# Patient Record
Sex: Male | Born: 2000 | Race: White | Hispanic: No | Marital: Single | Smoking: Never smoker
Health system: Southern US, Community
[De-identification: ages and names within clinical notes are randomized; demographics above are authoritative.]

---
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
## 2020-08-30 ENCOUNTER — Ambulatory Visit: Payer: Self-pay | Admitting: Family Medicine

## 2020-09-05 ENCOUNTER — Encounter: Payer: Self-pay | Admitting: Family Medicine

## 2020-09-05 ENCOUNTER — Ambulatory Visit (INDEPENDENT_AMBULATORY_CARE_PROVIDER_SITE_OTHER): Payer: Commercial Managed Care - PPO | Admitting: Family Medicine

## 2020-09-05 ENCOUNTER — Other Ambulatory Visit: Payer: Self-pay

## 2020-09-05 VITALS — BP 130/82 | HR 99 | Temp 98.8°F | Ht 73.0 in | Wt 193.0 lb

## 2020-09-05 DIAGNOSIS — Z6825 Body mass index (BMI) 25.0-25.9, adult: Secondary | ICD-10-CM | POA: Diagnosis not present

## 2020-09-05 DIAGNOSIS — Z7689 Persons encountering health services in other specified circumstances: Secondary | ICD-10-CM

## 2020-09-05 NOTE — Progress Notes (Signed)
New Patient Office Visit  Subjective:  Patient ID: Javier Hess, male    DOB: 12-Dec-2000  Age: 20 y.o. MRN: [id number]  CC:  Chief Complaint  Patient presents with  . New Patient (Initial Visit)    HPI Javier Hess presents for to establish care.  Javier Hess has been followed by a pediatrician but has now aged out. Javier Hess reports doing well and denies any concerns today. 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 Javier Hess denies family history of diabetes or heart disease. Javier Hess is active and reports a generally healthy diet. Javier Hess has seen an eye doctor in the past, but not recently. Javier Hess sees the dentist regularly.     History reviewed. No pertinent past medical history.  History reviewed. No pertinent surgical history.  Family History  Problem Relation Age of Onset  . Cancer Maternal Grandmother        Breast Cancer  . Stroke Paternal Grandfather     Social History   Socioeconomic History  . Marital status: Single    Spouse name: Not on file  . Number of children: Not on file  . Years of education: Not on file  . Highest education level: Not on file  Occupational History  . Occupation: Consulting civil engineer  Tobacco Use  . Smoking status: Never Smoker  . Smokeless tobacco: Never Used  Vaping Use  . Vaping Use: Never used  Substance and Sexual Activity  . Alcohol use: Not Currently  . Drug use: Never  . Sexual activity: Not Currently    Birth control/protection: Condom  Other Topics Concern  . Not on file  Social History Narrative  . Not on file   Social Determinants of Health   Financial Resource Strain: Not on file  Food Insecurity: Not on file  Transportation Needs: Not on file  Physical Activity: Not on file  Stress: Not on file  Social Connections: Not on file  Intimate Partner Violence: Not on file    ROS Review of Systems Negative unless specially indicated above in HPI.  Objective:   Today's Vitals: BP 130/82   Pulse 99   Temp  98.8 F (37.1 C) (Temporal)   Ht 6\' 1"  (1.854 m)   Wt 193 lb (87.5 kg)   BMI 25.46 kg/m   Physical Exam Vitals and nursing note reviewed.  Constitutional:      General: Javier Hess is not in acute distress.    Appearance: Normal appearance. Javier Hess is normal weight. Javier Hess is not ill-appearing, toxic-appearing or diaphoretic.  HENT:     Head: Normocephalic and atraumatic.     Right Ear: Tympanic membrane, ear canal and external ear normal.     Left Ear: Tympanic membrane, ear canal and external ear normal.     Nose: Nose normal.     Mouth/Throat:     Mouth: Mucous membranes are moist.     Pharynx: Oropharynx is clear. No oropharyngeal exudate or posterior oropharyngeal erythema.  Eyes:     Extraocular Movements: Extraocular movements intact.     Conjunctiva/sclera: Conjunctivae normal.     Pupils: Pupils are equal, round, and reactive to light.  Neck:     Vascular: No carotid bruit.  Cardiovascular:     Rate and Rhythm: Normal rate and regular rhythm.     Heart sounds: Normal heart sounds. No murmur heard.   Pulmonary:     Effort: Pulmonary effort is normal. No respiratory distress.     Breath sounds: Normal breath sounds.  Abdominal:     General: Bowel sounds are normal. There is no distension.     Palpations: Abdomen is soft.     Tenderness: There is no abdominal tenderness.  Musculoskeletal:        General: Normal range of motion.     Cervical back: Normal range of motion and neck supple. No tenderness.     Right lower leg: No edema.     Left lower leg: No edema.  Lymphadenopathy:     Cervical: No cervical adenopathy.  Skin:    General: Skin is warm and dry.  Neurological:     General: No focal deficit present.     Mental Status: Javier Hess is alert and oriented to person, place, and time.     Motor: No weakness.     Gait: Gait normal.  Psychiatric:        Mood and Affect: Mood normal.        Behavior: Behavior normal.        Thought Content: Thought content normal.        Judgment:  Judgment normal.     Assessment & Plan:   Javier Hess was seen today for new patient (initial visit).  Diagnoses and all orders for this visit:  BMI 25.0-25.9,adult Does not appear overweight. Patient declined lab work today. Javier Hess denies family history of diabetes or heart disease. Normal exam. Will readdress lab work at next visit. Healthcare maintenance handout given, with diet and exercise recommendations.   Encounter to establish care Reviewed records from pediatrician office. UTD on vaccines, no significant health history.    Follow-up: Return in about 1 year (around 09/05/2021) for CPE.   The patient indicates understanding of these issues and agrees with the plan.   Gabriel Earing, FNP

## 2020-09-05 NOTE — Patient Instructions (Signed)

## 2021-06-20 ENCOUNTER — Encounter: Payer: Self-pay | Admitting: *Deleted

## 2021-09-09 ENCOUNTER — Encounter: Payer: Commercial Managed Care - PPO | Admitting: Family Medicine

## 2021-09-09 ENCOUNTER — Encounter: Payer: Self-pay | Admitting: Family

## 2021-09-09 ENCOUNTER — Ambulatory Visit (INDEPENDENT_AMBULATORY_CARE_PROVIDER_SITE_OTHER): Payer: Commercial Managed Care - PPO | Admitting: Family

## 2021-09-09 VITALS — BP 111/70 | HR 75 | Temp 98.3°F | Ht 73.0 in | Wt 182.6 lb

## 2021-09-09 DIAGNOSIS — Z0000 Encounter for general adult medical examination without abnormal findings: Secondary | ICD-10-CM | POA: Diagnosis not present

## 2021-09-09 DIAGNOSIS — Z114 Encounter for screening for human immunodeficiency virus [HIV]: Secondary | ICD-10-CM | POA: Diagnosis not present

## 2021-09-09 DIAGNOSIS — Z1159 Encounter for screening for other viral diseases: Secondary | ICD-10-CM

## 2021-09-09 NOTE — Patient Instructions (Signed)

## 2021-09-09 NOTE — Progress Notes (Signed)
Subjective:    Patient ID: Javier Hess, male    DOB: 08-Aug-2001, 21 y.o.   MRN: [id number]  Chief Complaint  Patient presents with   Annual Exam    HPI Pt presents to the office today for CPE. Pt currently not taking any medications. Pt denies any headache, palpitations, SOB, or edema at this time.   Review of Systems  All other systems reviewed and are negative.  Family History  Problem Relation Age of Onset   Cancer Maternal Grandmother        Breast Cancer   Stroke Paternal Grandfather    Social History   Socioeconomic History   Marital status: Single    Spouse name: Not on file   Number of children: Not on file   Years of education: Not on file   Highest education level: Not on file  Occupational History   Occupation: student  Tobacco Use   Smoking status: Never   Smokeless tobacco: Never  Vaping Use   Vaping Use: Never used  Substance and Sexual Activity   Alcohol use: Not Currently   Drug use: Never   Sexual activity: Not Currently    Birth control/protection: Condom  Other Topics Concern   Not on file  Social History Narrative   Not on file   Social Determinants of Health   Financial Resource Strain: Not on file  Food Insecurity: Not on file  Transportation Needs: Not on file  Physical Activity: Not on file  Stress: Not on file  Social Connections: Not on file       Objective:   Physical Exam Vitals reviewed.  Constitutional:      General: He is not in acute distress.    Appearance: He is well-developed.  HENT:     Head: Normocephalic.     Right Ear: Tympanic membrane normal.     Left Ear: Tympanic membrane normal.  Eyes:     General:        Right eye: No discharge.        Left eye: No discharge.     Pupils: Pupils are equal, round, and reactive to light.  Neck:     Thyroid: No thyromegaly.  Cardiovascular:     Rate and Rhythm: Normal rate and regular rhythm.     Heart sounds: Normal heart sounds. No murmur heard. Pulmonary:      Effort: Pulmonary effort is normal. No respiratory distress.     Breath sounds: Normal breath sounds. No wheezing.  Abdominal:     General: Bowel sounds are normal. There is no distension.     Palpations: Abdomen is soft.     Tenderness: There is no abdominal tenderness.  Musculoskeletal:        General: No tenderness. Normal range of motion.     Cervical back: Normal range of motion and neck supple.  Skin:    General: Skin is warm and dry.     Findings: No erythema or rash.  Neurological:     Mental Status: He is alert and oriented to person, place, and time.     Cranial Nerves: No cranial nerve deficit.     Deep Tendon Reflexes: Reflexes are normal and symmetric.  Psychiatric:        Behavior: Behavior normal.        Thought Content: Thought content normal.        Judgment: Judgment normal.     BP 111/70    Pulse 75    Temp  98.3 F (36.8 C) (Temporal)    Ht 6' 1"  (1.854 m)    Wt 182 lb 9.6 oz (82.8 kg)    BMI 24.09 kg/m       Assessment & Plan:  MATAEO INGWERSEN comes in today with chief complaint of Annual Exam   Diagnosis and orders addressed:  1. Annual physical exam - CMP14+EGFR - CBC with Differential/Platelet - Lipid panel - HIV Antibody (routine testing w rflx) - TSH - Hepatitis C antibody  2. Need for hepatitis C screening test - CMP14+EGFR - CBC with Differential/Platelet - HIV Antibody (routine testing w rflx)  3. Encounter for screening for HIV - CMP14+EGFR - CBC with Differential/Platelet - Hepatitis C antibody   Labs pending Health Maintenance reviewed Diet and exercise encouraged  Follow up plan: 1 year    Evelina Dun, FNP

## 2021-09-10 LAB — CMP14+EGFR
ALT: 15 IU/L (ref 0–44)
AST: 18 IU/L (ref 0–40)
Albumin/Globulin Ratio: 1.8 (ref 1.2–2.2)
Albumin: 5.1 g/dL (ref 4.1–5.2)
Alkaline Phosphatase: 86 IU/L (ref 44–121)
BUN/Creatinine Ratio: 17 (ref 9–20)
BUN: 18 mg/dL (ref 6–20)
Bilirubin Total: 2.1 mg/dL — ABNORMAL HIGH (ref 0.0–1.2)
CO2: 24 mmol/L (ref 20–29)
Calcium: 10 mg/dL (ref 8.7–10.2)
Chloride: 100 mmol/L (ref 96–106)
Creatinine, Ser: 1.08 mg/dL (ref 0.76–1.27)
Globulin, Total: 2.8 g/dL (ref 1.5–4.5)
Glucose: 87 mg/dL (ref 70–99)
Potassium: 4.6 mmol/L (ref 3.5–5.2)
Sodium: 138 mmol/L (ref 134–144)
Total Protein: 7.9 g/dL (ref 6.0–8.5)
eGFR: 100 mL/min/{1.73_m2} (ref >59)

## 2021-09-10 LAB — LIPID PANEL
Chol/HDL Ratio: 3.6 ratio (ref 0.0–5.0)
Cholesterol, Total: 168 mg/dL (ref 100–199)
HDL: 47 mg/dL (ref >39)
LDL Chol Calc (NIH): 103 mg/dL — ABNORMAL HIGH (ref 0–99)
Triglycerides: 96 mg/dL (ref 0–149)
VLDL Cholesterol Cal: 18 mg/dL (ref 5–40)

## 2021-09-10 LAB — CBC WITH DIFFERENTIAL/PLATELET
Basophils Absolute: 0.1 10*3/uL (ref 0.0–0.2)
Basos: 1 %
EOS (ABSOLUTE): 0.3 10*3/uL (ref 0.0–0.4)
Eos: 4 %
Hematocrit: 48.7 % (ref 37.5–51.0)
Hemoglobin: 16.9 g/dL (ref 13.0–17.7)
Immature Grans (Abs): 0 10*3/uL (ref 0.0–0.1)
Immature Granulocytes: 0 %
Lymphocytes Absolute: 1.5 10*3/uL (ref 0.7–3.1)
Lymphs: 25 %
MCH: 29.5 pg (ref 26.6–33.0)
MCHC: 34.7 g/dL (ref 31.5–35.7)
MCV: 85 fL (ref 79–97)
Monocytes Absolute: 0.4 10*3/uL (ref 0.1–0.9)
Monocytes: 7 %
Neutrophils Absolute: 3.8 10*3/uL (ref 1.4–7.0)
Neutrophils: 63 %
Platelets: 278 10*3/uL (ref 150–450)
RBC: 5.72 x10E6/uL (ref 4.14–5.80)
RDW: 12.8 % (ref 11.6–15.4)
WBC: 6.1 10*3/uL (ref 3.4–10.8)

## 2021-09-10 LAB — HIV ANTIBODY (ROUTINE TESTING W REFLEX): HIV Screen 4th Generation wRfx: NONREACTIVE

## 2021-09-10 LAB — HEPATITIS C ANTIBODY: Hep C Virus Ab: <0.1 s/co ratio (ref 0.0–0.9)

## 2021-09-10 LAB — TSH: TSH: 1.23 u[IU]/mL (ref 0.450–4.500)

## 2022-09-11 ENCOUNTER — Encounter: Payer: Self-pay | Admitting: Family

## 2022-09-11 ENCOUNTER — Ambulatory Visit (INDEPENDENT_AMBULATORY_CARE_PROVIDER_SITE_OTHER): Payer: Commercial Managed Care - PPO | Admitting: Family

## 2022-09-11 VITALS — BP 114/75 | HR 88 | Temp 97.6°F | Ht 73.0 in | Wt 177.8 lb

## 2022-09-11 DIAGNOSIS — Z0001 Encounter for general adult medical examination with abnormal findings: Secondary | ICD-10-CM | POA: Diagnosis not present

## 2022-09-11 DIAGNOSIS — R590 Localized enlarged lymph nodes: Secondary | ICD-10-CM | POA: Diagnosis not present

## 2022-09-11 DIAGNOSIS — Z0000 Encounter for general adult medical examination without abnormal findings: Secondary | ICD-10-CM

## 2022-09-11 MED ORDER — FLUTICASONE PROPIONATE 50 MCG/ACT NA SUSP: 2.0000 | Freq: Every day | NASAL | 6 refills | Status: DC

## 2022-09-11 MED ORDER — CETIRIZINE HCL 10 MG PO TABS
10.0000 mg | ORAL_TABLET | Freq: Every day | ORAL | 1 refills | Status: DC
Start: 2022-09-11 — End: 2023-10-09

## 2022-09-11 NOTE — Patient Instructions (Signed)
Lymphadenopathy  Lymphadenopathy means that your lymph glands are swollen or larger than normal. Lymph glands, also called lymph nodes, are collections of tissue that filter excess fluid, bacteria, viruses, and waste from your bloodstream. They are part of your body's disease-fighting system (immune system), which protects your body from germs. There may be different causes of lymphadenopathy, depending on where it is in your body. Some types go away on their own. Lymphadenopathy can occur anywhere that you have lymph glands, including these areas: Neck (cervical lymphadenopathy). Chest (mediastinal lymphadenopathy). Lungs (hilar lymphadenopathy). Underarms (axillary lymphadenopathy). Groin (inguinal lymphadenopathy). When your immune system responds to germs, infection-fighting cells and fluid build up in your lymph glands. This causes some swelling and enlargement. If the lymph nodes do not go back to normal size after you have an infection or disease, your health care provider may do tests. These tests help to monitor your condition and find the reason why the glands are still swollen and enlarged. Follow these instructions at home:  Get plenty of rest. Your health care provider may recommend over-the-counter medicines for pain. Take over-the-counter and prescription medicines only as told by your health care provider. If directed, apply heat to swollen lymph glands as often as told by your health care provider. Use the heat source that your health care provider recommends, such as a moist heat pack or a heating pad. Place a towel between your skin and the heat source. Leave the heat on for 20-30 minutes. Remove the heat if your skin turns bright red. This is especially important if you are unable to feel pain, heat, or cold. You may have a greater risk of getting burned. Check your affected lymph glands every day for changes. Check other lymph gland areas as told by your health care provider.  Check for changes such as: More swelling. Sudden increase in size. Redness or pain. Hardness. Keep all follow-up visits. This is important. Contact a health care provider if you have: Lymph glands that: Are still swollen after 2 weeks. Have suddenly gotten bigger or the swelling spreads. Are red, painful, or hard. Fluid leaking from the skin near an enlarged lymph gland. Problems with breathing. A fever, chills, or night sweats. Fatigue. A sore throat. Pain in your abdomen. Weight loss. Get help right away if you have: Severe pain. Chest pain. Shortness of breath. These symptoms may represent a serious problem that is an emergency. Do not wait to see if the symptoms will go away. Get medical help right away. Call your local emergency services (911 in the [place] Do not drive yourself to the hospital. Summary Lymphadenopathy means that your lymph glands are swollen or larger than normal. Lymph glands, also called lymph nodes, are collections of tissue that filter excess fluid, bacteria, viruses, and waste from the bloodstream. They are part of your body's disease-fighting system (immune system). Lymphadenopathy can occur anywhere that you have lymph glands. If the lymph nodes do not go back to normal size after you have an infection or disease, your health care provider may do tests to monitor your condition and find the reason why the glands are still swollen and enlarged. Check your affected lymph glands every day for changes. Check other lymph gland areas as told by your health care provider. This information is not intended to replace advice given to you by your health care provider. Make sure you discuss any questions you have with your health care provider. Document Revised: 05/30/2020 Document Reviewed: 05/30/2020 Elsevier Patient Education  [employer].

## 2022-09-11 NOTE — Progress Notes (Signed)
Subjective:    Patient ID: Javier Hess, male    DOB: 29-Oct-2000, 22 y.o.   MRN: [id number]  Chief Complaint  Patient presents with   Annual Exam    Swollen node on right side of neck     HPI Pt presents to the office today for CPE. Pt currently not taking any medications. Pt denies any headache, palpitations, SOB, or edema at this time.   He is complaining of a right swollen cervical lymph node. Reports he noticed it about 6 months ago and is unchanged. Does report tenderness when moving it. Denies any fever, ear pain, sore throat, night sweats, or fatigue.    Review of Systems  All other systems reviewed and are negative.  Family History  Problem Relation Age of Onset   Cancer Maternal Grandmother        Breast Cancer   Stroke Paternal Grandfather    Social History   Socioeconomic History   Marital status: Single    Spouse name: Not on file   Number of children: Not on file   Years of education: Not on file   Highest education level: Not on file  Occupational History   Occupation: student  Tobacco Use   Smoking status: Never   Smokeless tobacco: Never  Vaping Use   Vaping Use: Never used  Substance and Sexual Activity   Alcohol use: Not Currently   Drug use: Never   Sexual activity: Not Currently    Birth control/protection: Condom  Other Topics Concern   Not on file  Social History Narrative   Not on file   Social Determinants of Health   Financial Resource Strain: Not on file  Food Insecurity: Not on file  Transportation Needs: Not on file  Physical Activity: Not on file  Stress: Not on file  Social Connections: Not on file       Objective:   Physical Exam Vitals reviewed.  Constitutional:      General: He is not in acute distress.    Appearance: He is well-developed.  HENT:     Head: Normocephalic.      Comments: Moveable, tender lymphopathy on right cervical     Right Ear: Tympanic membrane normal.     Left Ear: Tympanic membrane  normal.  Eyes:     General:        Right eye: No discharge.        Left eye: No discharge.     Pupils: Pupils are equal, round, and reactive to light.  Neck:     Thyroid: No thyromegaly.  Cardiovascular:     Rate and Rhythm: Normal rate and regular rhythm.     Heart sounds: Normal heart sounds. No murmur heard. Pulmonary:     Effort: Pulmonary effort is normal. No respiratory distress.     Breath sounds: Normal breath sounds. No wheezing.  Abdominal:     General: Bowel sounds are normal. There is no distension.     Palpations: Abdomen is soft.     Tenderness: There is no abdominal tenderness.  Musculoskeletal:        General: No tenderness. Normal range of motion.     Cervical back: Normal range of motion and neck supple.  Skin:    General: Skin is warm and dry.     Findings: No erythema or rash.  Neurological:     Mental Status: He is alert and oriented to person, place, and time.     Cranial Nerves: No  cranial nerve deficit.     Deep Tendon Reflexes: Reflexes are normal and symmetric.  Psychiatric:        Behavior: Behavior normal.        Thought Content: Thought content normal.        Judgment: Judgment normal.     BP 114/75   Pulse 88   Temp 97.6 F (36.4 C) (Temporal)   Ht 6\' 1"  (1.854 m)   Wt 177 lb 12.8 oz (80.6 kg)   SpO2 100%   BMI 23.46 kg/m        Assessment & Plan:  Javier Hess comes in today with chief complaint of Annual Exam (Swollen node on right side of neck )   Diagnosis and orders addressed:  1. Annual physical exam  - CMP14+EGFR - CBC with Differential/Platelet - TSH  2. Cervical lymphadenopathy Start zyrtec and flonase [place] ordered  - US Soft Tissue Head/Neck (NON-THYROID); Future - cetirizine (ZYRTEC ALLERGY) 10 MG tablet; Take 1 tablet (10 mg total) by mouth daily.  Dispense: 90 tablet; Refill: 1 - fluticasone (FLONASE) 50 MCG/ACT nasal spray; Place 2 sprays into both nostrils daily.  Dispense: 16 g; Refill: 6 - EBV VCA/EA  Ab, IgG   Labs pending Health Maintenance reviewed Diet and exercise encouraged  Follow up plan: 1 year   Evelina Dun, FNP

## 2022-09-12 LAB — CMP14+EGFR
ALT: 14 IU/L (ref 0–44)
AST: 17 IU/L (ref 0–40)
Albumin/Globulin Ratio: 2 (ref 1.2–2.2)
Albumin: 5.3 g/dL — ABNORMAL HIGH (ref 4.3–5.2)
Alkaline Phosphatase: 81 IU/L (ref 44–121)
BUN/Creatinine Ratio: 13 (ref 9–20)
BUN: 15 mg/dL (ref 6–20)
Bilirubin Total: 2.1 mg/dL — ABNORMAL HIGH (ref 0.0–1.2)
CO2: 20 mmol/L (ref 20–29)
Calcium: 10.4 mg/dL — ABNORMAL HIGH (ref 8.7–10.2)
Chloride: 100 mmol/L (ref 96–106)
Creatinine, Ser: 1.19 mg/dL (ref 0.76–1.27)
Globulin, Total: 2.6 g/dL (ref 1.5–4.5)
Glucose: 92 mg/dL (ref 70–99)
Potassium: 4.9 mmol/L (ref 3.5–5.2)
Sodium: 140 mmol/L (ref 134–144)
Total Protein: 7.9 g/dL (ref 6.0–8.5)
eGFR: 89 mL/min/{1.73_m2} (ref >59)

## 2022-09-12 LAB — CBC WITH DIFFERENTIAL/PLATELET
Basophils Absolute: 0 10*3/uL (ref 0.0–0.2)
Basos: 1 %
EOS (ABSOLUTE): 0.2 10*3/uL (ref 0.0–0.4)
Eos: 3 %
Hematocrit: 46.9 % (ref 37.5–51.0)
Hemoglobin: 16.8 g/dL (ref 13.0–17.7)
Immature Grans (Abs): 0 10*3/uL (ref 0.0–0.1)
Immature Granulocytes: 0 %
Lymphocytes Absolute: 2.1 10*3/uL (ref 0.7–3.1)
Lymphs: 29 %
MCH: 29.7 pg (ref 26.6–33.0)
MCHC: 35.8 g/dL — ABNORMAL HIGH (ref 31.5–35.7)
MCV: 83 fL (ref 79–97)
Monocytes Absolute: 0.6 10*3/uL (ref 0.1–0.9)
Monocytes: 8 %
Neutrophils Absolute: 4.2 10*3/uL (ref 1.4–7.0)
Neutrophils: 59 %
Platelets: 273 10*3/uL (ref 150–450)
RBC: 5.65 x10E6/uL (ref 4.14–5.80)
RDW: 12.4 % (ref 11.6–15.4)
WBC: 7.1 10*3/uL (ref 3.4–10.8)

## 2022-09-12 LAB — EBV VCA/EA AB, IGG
EBV Early Antigen Ab, IgG: <9 U/mL (ref 0.0–8.9)
EBV VCA IgG: 29.3 U/mL — ABNORMAL HIGH (ref 0.0–17.9)

## 2022-09-12 LAB — TSH: TSH: 2.19 u[IU]/mL (ref 0.450–4.500)

## 2022-09-18 ENCOUNTER — Ambulatory Visit (HOSPITAL_COMMUNITY): Payer: Managed Care, Other (non HMO)

## 2022-09-19 ENCOUNTER — Ambulatory Visit (HOSPITAL_COMMUNITY)
Admission: RE | Admit: 2022-09-19 | Discharge: 2022-09-19 | Disposition: A | Discharge status: Home / Self Care | Payer: Commercial Managed Care - PPO | Source: Ambulatory Visit | Attending: Family | Admitting: Family

## 2022-09-19 DIAGNOSIS — R590 Localized enlarged lymph nodes: Secondary | ICD-10-CM | POA: Diagnosis present

## 2022-09-22 ENCOUNTER — Other Ambulatory Visit: Payer: Self-pay | Admitting: Family

## 2022-09-22 DIAGNOSIS — R590 Localized enlarged lymph nodes: Secondary | ICD-10-CM

## 2022-10-20 ENCOUNTER — Ambulatory Visit (HOSPITAL_COMMUNITY): Payer: Commercial Managed Care - PPO

## 2022-11-14 ENCOUNTER — Encounter (HOSPITAL_COMMUNITY): Payer: Self-pay | Admitting: Radiology

## 2022-11-14 ENCOUNTER — Ambulatory Visit (HOSPITAL_COMMUNITY)
Admission: RE | Admit: 2022-11-14 | Discharge: 2022-11-14 | Disposition: A | Discharge status: Home / Self Care | Payer: Commercial Managed Care - PPO | Source: Ambulatory Visit | Attending: Family | Admitting: Family

## 2022-11-14 DIAGNOSIS — R590 Localized enlarged lymph nodes: Secondary | ICD-10-CM | POA: Diagnosis present

## 2022-11-14 MED ORDER — IOHEXOL 300 MG/ML  SOLN
75.0000 mL | Freq: Once | INTRAMUSCULAR | Status: AC | PRN
  Administered 2022-11-14: 75 mL via INTRAVENOUS

## 2022-11-18 ENCOUNTER — Other Ambulatory Visit: Payer: Self-pay | Admitting: Family

## 2022-11-18 DIAGNOSIS — K118 Other diseases of salivary glands: Secondary | ICD-10-CM

## 2022-11-18 NOTE — Progress Notes (Signed)
Aletta Edouard, MD  Donita Brooks D PROCEDURE / BIOPSY REVIEW Date: 11/18/22  Requested Biopsy site: Right parotid mass Reason for request: Right parotid mass Imaging review: Best seen on [place] and CT  Decision: Approved Imaging modality to perform: Ultrasound Schedule with: No sedation / Local anesthetic Schedule for: Any VIR  Additional comments:   Please contact me with questions, concerns, or if issue pertaining to this request arise.  Azzie Roup, MD [facility]

## 2022-12-05 ENCOUNTER — Ambulatory Visit (HOSPITAL_COMMUNITY)
Admission: RE | Admit: 2022-12-05 | Discharge: 2022-12-05 | Disposition: A | Discharge status: Home / Self Care | Payer: Commercial Managed Care - PPO | Source: Ambulatory Visit | Attending: Family | Admitting: Family

## 2022-12-05 DIAGNOSIS — K118 Other diseases of salivary glands: Secondary | ICD-10-CM | POA: Diagnosis present

## 2022-12-05 MED ORDER — LIDOCAINE HCL (PF) 1 % IJ SOLN
5.0000 mL | Freq: Once | INTRAMUSCULAR | Status: AC
  Administered 2022-12-05: 5 mL via INTRADERMAL

## 2022-12-08 ENCOUNTER — Other Ambulatory Visit: Payer: Self-pay | Admitting: Family

## 2022-12-08 DIAGNOSIS — D119 Benign neoplasm of major salivary gland, unspecified: Secondary | ICD-10-CM

## 2022-12-08 LAB — SURGICAL PATHOLOGY

## 2022-12-08 NOTE — Progress Notes (Signed)
Pt r/c.

## 2022-12-08 NOTE — Progress Notes (Signed)
Patient r/c. Please call back  

## 2023-01-21 ENCOUNTER — Telehealth: Payer: Self-pay | Admitting: Family

## 2023-01-21 NOTE — Telephone Encounter (Signed)
Called and advised patient to contact Medical Records at [facility] as I am unable to burn CT and [place] images.

## 2023-09-18 ENCOUNTER — Encounter: Payer: Commercial Managed Care - PPO | Admitting: Family

## 2023-10-02 ENCOUNTER — Encounter: Payer: Commercial Managed Care - PPO | Admitting: Family

## 2023-10-09 ENCOUNTER — Ambulatory Visit: Payer: Commercial Managed Care - PPO | Admitting: Family

## 2023-10-09 ENCOUNTER — Encounter: Payer: Self-pay | Admitting: Family

## 2023-10-09 VITALS — BP 114/80 | HR 76 | Temp 97.8°F | Ht 73.0 in | Wt 185.0 lb

## 2023-10-09 DIAGNOSIS — Z0000 Encounter for general adult medical examination without abnormal findings: Secondary | ICD-10-CM | POA: Diagnosis not present

## 2023-10-09 LAB — CBC WITH DIFFERENTIAL/PLATELET
Basophils Absolute: 0.1 10*3/uL (ref 0.0–0.2)
Basos: 1 %
EOS (ABSOLUTE): 0.2 10*3/uL (ref 0.0–0.4)
Eos: 3 %
Hematocrit: 50.7 % (ref 37.5–51.0)
Hemoglobin: 17.5 g/dL (ref 13.0–17.7)
Immature Grans (Abs): 0 10*3/uL (ref 0.0–0.1)
Immature Granulocytes: 0 %
Lymphocytes Absolute: 2 10*3/uL (ref 0.7–3.1)
Lymphs: 29 %
MCH: 30.6 pg (ref 26.6–33.0)
MCHC: 34.5 g/dL (ref 31.5–35.7)
MCV: 89 fL (ref 79–97)
Monocytes Absolute: 0.5 10*3/uL (ref 0.1–0.9)
Monocytes: 7 %
Neutrophils Absolute: 4.2 10*3/uL (ref 1.4–7.0)
Neutrophils: 60 %
Platelets: 311 10*3/uL (ref 150–450)
RBC: 5.72 x10E6/uL (ref 4.14–5.80)
RDW: 12.8 % (ref 11.6–15.4)
WBC: 7 10*3/uL (ref 3.4–10.8)

## 2023-10-09 LAB — LIPID PANEL
Chol/HDL Ratio: 4 {ratio} (ref 0.0–5.0)
Cholesterol, Total: 201 mg/dL — ABNORMAL HIGH (ref 100–199)
HDL: 50 mg/dL (ref >39)
LDL Chol Calc (NIH): 128 mg/dL — ABNORMAL HIGH (ref 0–99)
Triglycerides: 126 mg/dL (ref 0–149)
VLDL Cholesterol Cal: 23 mg/dL (ref 5–40)

## 2023-10-09 LAB — CMP14+EGFR
ALT: 16 [IU]/L (ref 0–44)
AST: 20 [IU]/L (ref 0–40)
Albumin: 5 g/dL (ref 4.3–5.2)
Alkaline Phosphatase: 102 [IU]/L (ref 44–121)
BUN/Creatinine Ratio: 16 (ref 9–20)
BUN: 20 mg/dL (ref 6–20)
Bilirubin Total: 1.5 mg/dL — ABNORMAL HIGH (ref 0.0–1.2)
CO2: 21 mmol/L (ref 20–29)
Calcium: 10.2 mg/dL (ref 8.7–10.2)
Chloride: 101 mmol/L (ref 96–106)
Creatinine, Ser: 1.23 mg/dL (ref 0.76–1.27)
Globulin, Total: 3.2 g/dL (ref 1.5–4.5)
Glucose: 83 mg/dL (ref 70–99)
Potassium: 4.4 mmol/L (ref 3.5–5.2)
Sodium: 138 mmol/L (ref 134–144)
Total Protein: 8.2 g/dL (ref 6.0–8.5)
eGFR: 85 mL/min/{1.73_m2} (ref >59)

## 2023-10-09 NOTE — Progress Notes (Signed)
Subjective:    Patient ID: Javier Hess, male    DOB: 11/03/2000, 23 y.o.   MRN: [id number]  Chief Complaint  Patient presents with   Annual Exam    HPI Pt presents to the office today for CPE. Pt currently not taking any medications. Pt denies any headache, palpitations, SOB, or edema at this time.     Review of Systems  All other systems reviewed and are negative.  Family History  Problem Relation Age of Onset   Cancer Maternal Grandmother        Breast Cancer   Stroke Paternal Grandfather    Social History   Socioeconomic History   Marital status: Single    Spouse name: Not on file   Number of children: Not on file   Years of education: Not on file   Highest education level: Bachelor's degree (e.g., BA, AB, BS)  Occupational History   Occupation: student  Tobacco Use   Smoking status: Never   Smokeless tobacco: Never  Vaping Use   Vaping status: Never Used  Substance and Sexual Activity   Alcohol use: Not Currently   Drug use: Never   Sexual activity: Not Currently    Birth control/protection: Condom  Other Topics Concern   Not on file  Social History Narrative   Not on file   Social Drivers of Health   Financial Resource Strain: Low Risk  (09/28/2023)   Overall Financial Resource Strain (CARDIA)    Difficulty of Paying Living Expenses: Not hard at all  Food Insecurity: No Food Insecurity (09/28/2023)   Hunger Vital Sign    Worried About Running Out of Food in the Last Year: Never true    Ran Out of Food in the Last Year: Never true  Transportation Needs: No Transportation Needs (09/28/2023)   PRAPARE - Administrator, Civil Service (Medical): No    Lack of Transportation (Non-Medical): No  Physical Activity: Sufficiently Active (09/28/2023)   Exercise Vital Sign    Days of Exercise per Week: 6 days    Minutes of Exercise per Session: 50 min  Stress: No Stress Concern Present (09/28/2023)   [employer] of Occupational Health -  Occupational Stress Questionnaire    Feeling of Stress : Not at all  Social Connections: Moderately Isolated (09/28/2023)   Social Connection and Isolation Panel [NHANES]    Frequency of Communication with Friends and Family: More than three times a week    Frequency of Social Gatherings with Friends and Family: More than three times a week    Attends Religious Services: 1 to 4 times per year    Active Member of [employer] or Organizations: No    Attends Engineer, structural: Not on file    Marital Status: Never married       Objective:   Physical Exam Vitals reviewed.  Constitutional:      General: He is not in acute distress.    Appearance: He is well-developed.  HENT:     Head: Normocephalic.     Right Ear: Tympanic membrane normal.     Left Ear: Tympanic membrane normal.  Eyes:     General:        Right eye: No discharge.        Left eye: No discharge.     Pupils: Pupils are equal, round, and reactive to light.  Neck:     Thyroid: No thyromegaly.  Cardiovascular:     Rate and Rhythm: Normal rate  and regular rhythm.     Heart sounds: Normal heart sounds. No murmur heard. Pulmonary:     Effort: Pulmonary effort is normal. No respiratory distress.     Breath sounds: Normal breath sounds. No wheezing.  Abdominal:     General: Bowel sounds are normal. There is no distension.     Palpations: Abdomen is soft.     Tenderness: There is no abdominal tenderness.  Musculoskeletal:        General: No tenderness. Normal range of motion.     Cervical back: Normal range of motion and neck supple.  Skin:    General: Skin is warm and dry.     Findings: No erythema or rash.  Neurological:     Mental Status: He is alert and oriented to person, place, and time.     Cranial Nerves: No cranial nerve deficit.     Deep Tendon Reflexes: Reflexes are normal and symmetric.  Psychiatric:        Behavior: Behavior normal.        Thought Content: Thought content normal.        Judgment:  Judgment normal.     BP 114/80   Pulse 76   Temp 97.8 F (36.6 C)   Ht 6\' 1"  (1.854 m)   Wt 185 lb (83.9 kg)   SpO2 100%   BMI 24.41 kg/m        Assessment & Plan:  Javier Hess comes in today with chief complaint of Annual Exam   Diagnosis and orders addressed:  1. Annual physical exam (Primary) - CMP14+EGFR - CBC with Differential/Platelet - Lipid panel    Labs pending Health Maintenance reviewed Diet and exercise encouraged  Follow up plan: 1 year   Jannifer Rodney, FNP

## 2023-10-09 NOTE — Patient Instructions (Signed)
 Health Maintenance, Male  Adopting a healthy lifestyle and getting preventive care are important in promoting health and wellness. Ask your health care provider about:  The right schedule for you to have regular tests and exams.  Things you can do on your own to prevent diseases and keep yourself healthy.  What should I know about diet, weight, and exercise?  Eat a healthy diet    Eat a diet that includes plenty of vegetables, fruits, low-fat dairy products, and lean protein.  Do not eat a lot of foods that are high in solid fats, added sugars, or sodium.  Maintain a healthy weight  Body mass index (BMI) is a measurement that can be used to identify possible weight problems. It estimates body fat based on height and weight. Your health care provider can help determine your BMI and help you achieve or maintain a healthy weight.  Get regular exercise  Get regular exercise. This is one of the most important things you can do for your health. Most adults should:  Exercise for at least 150 minutes each week. The exercise should increase your heart rate and make you sweat (moderate-intensity exercise).  Do strengthening exercises at least twice a week. This is in addition to the moderate-intensity exercise.  Spend less time sitting. Even light physical activity can be beneficial.  Watch cholesterol and blood lipids  Have your blood tested for lipids and cholesterol at 24 years of age, then have this test every 5 years.  You may need to have your cholesterol levels checked more often if:  Your lipid or cholesterol levels are high.  You are older than 24 years of age.  You are at high risk for heart disease.  What should I know about cancer screening?  Many types of cancers can be detected early and may often be prevented. Depending on your health history and family history, you may need to have cancer screening at various ages. This may include screening for:  Colorectal cancer.  Prostate cancer.  Skin cancer.  Lung  cancer.  What should I know about heart disease, diabetes, and high blood pressure?  Blood pressure and heart disease  High blood pressure causes heart disease and increases the risk of stroke. This is more likely to develop in people who have high blood pressure readings or are overweight.  Talk with your health care provider about your target blood pressure readings.  Have your blood pressure checked:  Every 3-5 years if you are 9-95 years of age.  Every year if you are 85 years old or older.  If you are between the ages of 29 and 29 and are a current or former smoker, ask your health care provider if you should have a one-time screening for abdominal aortic aneurysm (AAA).  Diabetes  Have regular diabetes screenings. This checks your fasting blood sugar level. Have the screening done:  Once every three years after age 23 if you are at a normal weight and have a low risk for diabetes.  More often and at a younger age if you are overweight or have a high risk for diabetes.  What should I know about preventing infection?  Hepatitis B  If you have a higher risk for hepatitis B, you should be screened for this virus. Talk with your health care provider to find out if you are at risk for hepatitis B infection.  Hepatitis C  Blood testing is recommended for:  Everyone born from [birth year] through 1966.  Anyone  with known risk factors for hepatitis C.  Sexually transmitted infections (STIs)  You should be screened each year for STIs, including gonorrhea and chlamydia, if:  You are sexually active and are younger than 24 years of age.  You are older than 24 years of age and your health care provider tells you that you are at risk for this type of infection.  Your sexual activity has changed since you were last screened, and you are at increased risk for chlamydia or gonorrhea. Ask your health care provider if you are at risk.  Ask your health care provider about whether you are at high risk for HIV. Your health care provider  may recommend a prescription medicine to help prevent HIV infection. If you choose to take medicine to prevent HIV, you should first get tested for HIV. You should then be tested every 3 months for as long as you are taking the medicine.  Follow these instructions at home:  Alcohol use  Do not drink alcohol if your health care provider tells you not to drink.  If you drink alcohol:  Limit how much you have to 0-2 drinks a day.  Know how much alcohol is in your drink. In the [place] one drink equals one 12 oz bottle of beer (355 mL), one 5 oz glass of wine (148 mL), or one 1 oz glass of hard liquor (44 mL).  Lifestyle  Do not use any products that contain nicotine or tobacco. These products include cigarettes, chewing tobacco, and vaping devices, such as e-cigarettes. If you need help quitting, ask your health care provider.  Do not use street drugs.  Do not share needles.  Ask your health care provider for help if you need support or information about quitting drugs.  General instructions  Schedule regular health, dental, and eye exams.  Stay current with your vaccines.  Tell your health care provider if:  You often feel depressed.  You have ever been abused or do not feel safe at home.  Summary  Adopting a healthy lifestyle and getting preventive care are important in promoting health and wellness.  Follow your health care provider's instructions about healthy diet, exercising, and getting tested or screened for diseases.  Follow your health care provider's instructions on monitoring your cholesterol and blood pressure.  This information is not intended to replace advice given to you by your health care provider. Make sure you discuss any questions you have with your health care provider.  Document Revised: 12/24/2020 Document Reviewed: 12/24/2020  Elsevier Patient Education  2024 [employer].

## 2024-04-08 ENCOUNTER — Ambulatory Visit: Payer: Commercial Managed Care - PPO | Admitting: Family

## 2024-04-08 ENCOUNTER — Encounter: Payer: Self-pay | Admitting: Family

## 2024-04-08 VITALS — BP 115/76 | HR 65 | Temp 97.6°F | Ht 73.0 in | Wt 183.0 lb

## 2024-04-08 DIAGNOSIS — E782 Mixed hyperlipidemia: Secondary | ICD-10-CM | POA: Insufficient documentation

## 2024-04-08 LAB — LIPID PANEL

## 2024-04-08 NOTE — Patient Instructions (Signed)
 Dyslipidemia Dyslipidemia is an imbalance of waxy, fat-like substances (lipids) in the blood. The body needs lipids in small amounts. Dyslipidemia often involves a high level of cholesterol or triglycerides, which are types of lipids. Common forms of dyslipidemia include: High levels of LDL cholesterol. LDL is the type of cholesterol that causes fatty deposits (plaques) to build up in the blood vessels that carry blood away from the heart (arteries). Low levels of HDL cholesterol. HDL cholesterol is the type of cholesterol that protects against heart disease. High levels of HDL remove the LDL buildup from arteries. High levels of triglycerides. Triglycerides are a fatty substance in the blood that is linked to a buildup of plaques in the arteries. What are the causes? There are two main types of dyslipidemia: primary and secondary. Primary dyslipidemia is caused by changes (mutations) in genes that are passed down through families (inherited). These mutations cause several types of dyslipidemia. Secondary dyslipidemia may be caused by various risk factors that can lead to the disease, such as lifestyle choices and certain medical conditions. What increases the risk? You are more likely to develop this condition if you are an older man or if you are a woman who has gone through menopause. Other risk factors include: Having a family history of dyslipidemia. Taking certain medicines, including birth control pills, steroids, some diuretics, and beta-blockers. Eating a diet high in saturated fat. Smoking cigarettes or excessive alcohol intake. Having certain medical conditions such as diabetes, polycystic ovary syndrome (PCOS), kidney disease, liver disease, or hypothyroidism. Not exercising regularly. Being overweight or obese with too much belly fat. What are the signs or symptoms? In most cases, dyslipidemia does not usually cause any symptoms. In severe cases, very high lipid levels can  cause: Fatty bumps under the skin (xanthomas). A white or gray ring around the black center (pupil) of the eye. Very high triglyceride levels can cause inflammation of the pancreas (pancreatitis). How is this diagnosed? Your health care provider may diagnose dyslipidemia based on a routine blood test (fasting blood test). Because most people do not have symptoms of the condition, this blood testing (lipid profile) is done on adults age 69 and older and is repeated every 4-6 years. This test checks: Total cholesterol. This measures the total amount of cholesterol in your blood, including LDL cholesterol, HDL cholesterol, and triglycerides. A healthy number is below 200 mg/dL (4.82 mmol/L). LDL cholesterol. The target number for LDL cholesterol is different for each person, depending on individual risk factors. A healthy number is usually below 100 mg/dL (7.40 mmol/L). Ask your health care provider what your LDL cholesterol should be. HDL cholesterol. An HDL level of 60 mg/dL (8.44 mmol/L) or higher is best because it helps to protect against heart disease. A number below 40 mg/dL (8.96 mmol/L) for men or below 50 mg/dL (8.70 mmol/L) for women increases the risk for heart disease. Triglycerides. A healthy triglyceride number is below 150 mg/dL (8.30 mmol/L). If your lipid profile is abnormal, your health care provider may do other blood tests. How is this treated? Treatment depends on the type of dyslipidemia that you have and your other risk factors for heart disease and stroke. Your health care provider will have a target range for your lipid levels based on this information. Treatment for dyslipidemia starts with lifestyle changes, such as diet and exercise. Your health care provider may recommend that you: Get regular exercise. Make changes to your diet. Quit smoking if you smoke. Limit your alcohol intake. If diet  changes and exercise do not help you reach your goals, your health care provider  may also prescribe medicine to lower lipids. The most commonly prescribed type of medicine lowers your LDL cholesterol (statin drug). If you have a high triglyceride level, your provider may prescribe another type of drug (fibrate) or an omega-3 fish oil supplement, or both. Follow these instructions at home: Eating and drinking  Follow instructions from your health care provider or dietitian about eating or drinking restrictions. Eat a healthy diet as told by your health care provider. This can help you reach and maintain a healthy weight, lower your LDL cholesterol, and raise your HDL cholesterol. This may include: Limiting your calories, if you are overweight. Eating more fruits, vegetables, whole grains, fish, and lean meats. Limiting saturated fat, trans fat, and cholesterol. Do not drink alcohol if: Your health care provider tells you not to drink. You are pregnant, may be pregnant, or are planning to become pregnant. If you drink alcohol: Limit how much you have to: 0-1 drink a day for women. 0-2 drinks a day for men. Know how much alcohol is in your drink. In the [place] one drink equals one 12 oz bottle of beer (355 mL), one 5 oz glass of wine (148 mL), or one 1 oz glass of hard liquor (44 mL). Activity Get regular exercise. Start an exercise and strength training program as told by your health care provider. Ask your health care provider what activities are safe for you. Your health care provider may recommend: 30 minutes of aerobic activity 4-6 days a week. Brisk walking is an example of aerobic activity. Strength training 2 days a week. General instructions Do not use any products that contain nicotine or tobacco. These products include cigarettes, chewing tobacco, and vaping devices, such as e-cigarettes. If you need help quitting, ask your health care provider. Take over-the-counter and prescription medicines only as told by your health care provider. This includes  supplements. Keep all follow-up visits. This is important. Contact a health care provider if: You are having trouble sticking to your exercise or diet plan. You are struggling to quit smoking or to control your use of alcohol. Summary Dyslipidemia often involves a high level of cholesterol or triglycerides, which are types of lipids. Treatment depends on the type of dyslipidemia that you have and your other risk factors for heart disease and stroke. Treatment for dyslipidemia starts with lifestyle changes, such as diet and exercise. Your health care provider may prescribe medicine to lower lipids. This information is not intended to replace advice given to you by your health care provider. Make sure you discuss any questions you have with your health care provider. Document Revised: 03/07/2022 Document Reviewed: 10/08/2020 Elsevier Patient Education  2025 [employer].

## 2024-04-08 NOTE — Progress Notes (Signed)
 Subjective:    Patient ID: Javier Hess, male    DOB: [birth year], 23 y.o.   MRN: [id number]  Chief Complaint  Patient presents with   Medical Management of Chronic Issues   Pt presents to the office today for chronic follow up. Pt denies any headache, palpitations, SOB, or edema at this time.    Hyperlipidemia This is a chronic problem. The current episode started more than 1 year ago. The problem is uncontrolled. Current antihyperlipidemic treatment includes diet change. The current treatment provides mild improvement of lipids. Risk factors for coronary artery disease include male sex and dyslipidemia.      Review of Systems  All other systems reviewed and are negative.   Social History   Socioeconomic History   Marital status: Single    Spouse name: Not on file   Number of children: Not on file   Years of education: Not on file   Highest education level: Bachelor's degree (e.g., BA, AB, BS)  Occupational History   Occupation: student  Tobacco Use   Smoking status: Never   Smokeless tobacco: Never  Vaping Use   Vaping status: Never Used  Substance and Sexual Activity   Alcohol use: Not Currently   Drug use: Never   Sexual activity: Not Currently    Birth control/protection: Condom  Other Topics Concern   Not on file  Social History Narrative   Not on file   Social Drivers of Health   Financial Resource Strain: Low Risk  (04/04/2024)   Overall Financial Resource Strain (CARDIA)    Difficulty of Paying Living Expenses: Not hard at all  Food Insecurity: No Food Insecurity (04/04/2024)   Hunger Vital Sign    Worried About Running Out of Food in the Last Year: Never true    Ran Out of Food in the Last Year: Never true  Transportation Needs: No Transportation Needs (04/04/2024)   PRAPARE - Administrator, Civil Service (Medical): No    Lack of Transportation (Non-Medical): No  Physical Activity: Sufficiently Active (04/04/2024)   Exercise Vital  Sign    Days of Exercise per Week: 5 days    Minutes of Exercise per Session: 60 min  Stress: No Stress Concern Present (04/04/2024)   [employer] of Occupational Health - Occupational Stress Questionnaire    Feeling of Stress: Not at all  Social Connections: Socially Integrated (04/04/2024)   Social Connection and Isolation Panel    Frequency of Communication with Friends and Family: More than three times a week    Frequency of Social Gatherings with Friends and Family: More than three times a week    Attends Religious Services: More than 4 times per year    Active Member of [employer] or Organizations: Yes    Attends Engineer, structural: More than 4 times per year    Marital Status: Married   Family History  Problem Relation Age of Onset   Cancer Maternal Grandmother        Breast Cancer   Stroke Paternal Grandfather         Objective:   Physical Exam Vitals reviewed.  Constitutional:      General: He is not in acute distress.    Appearance: He is well-developed.  HENT:     Head: Normocephalic.     Right Ear: Tympanic membrane normal.     Left Ear: Tympanic membrane normal.  Eyes:     General:        Right  eye: No discharge.        Left eye: No discharge.     Pupils: Pupils are equal, round, and reactive to light.  Neck:     Thyroid : No thyromegaly.  Cardiovascular:     Rate and Rhythm: Normal rate and regular rhythm.     Heart sounds: Normal heart sounds. No murmur heard. Pulmonary:     Effort: Pulmonary effort is normal. No respiratory distress.     Breath sounds: Normal breath sounds. No wheezing.  Abdominal:     General: Bowel sounds are normal. There is no distension.     Palpations: Abdomen is soft.     Tenderness: There is no abdominal tenderness.  Musculoskeletal:        General: No tenderness. Normal range of motion.     Cervical back: Normal range of motion and neck supple.  Skin:    General: Skin is warm and dry.     Findings: No erythema  or rash.  Neurological:     Mental Status: He is alert and oriented to person, place, and time.     Cranial Nerves: No cranial nerve deficit.     Deep Tendon Reflexes: Reflexes are normal and symmetric.  Psychiatric:        Behavior: Behavior normal.        Thought Content: Thought content normal.        Judgment: Judgment normal.       BP 115/76   Pulse 65   Temp 97.6 F (36.4 C)   Ht 6' 1 (1.854 m)   Wt 183 lb (83 kg)   BMI 24.14 kg/m      Assessment & Plan:  Javier Hess comes in today with chief complaint of Medical Management of Chronic Issues   Diagnosis and orders addressed:  1. Mixed hyperlipidemia (Primary) Low fat diet  - CMP14+EGFR - Lipid panel   Labs pending Health Maintenance reviewed Diet and exercise encouraged  Return in about 1 year (around 04/08/2025), or if symptoms worsen or fail to improve.    Bari Learn, FNP

## 2024-04-09 LAB — CMP14+EGFR
ALT: 18 IU/L (ref 0–44)
AST: 20 IU/L (ref 0–40)
Albumin: 5.1 g/dL (ref 4.3–5.2)
Alkaline Phosphatase: 89 IU/L (ref 44–121)
BUN/Creatinine Ratio: 13 (ref 9–20)
BUN: 16 mg/dL (ref 6–20)
Bilirubin Total: 1.7 mg/dL — ABNORMAL HIGH (ref 0.0–1.2)
CO2: 20 mmol/L (ref 20–29)
Calcium: 10.4 mg/dL — ABNORMAL HIGH (ref 8.7–10.2)
Chloride: 100 mmol/L (ref 96–106)
Creatinine, Ser: 1.2 mg/dL (ref 0.76–1.27)
Globulin, Total: 2.7 g/dL (ref 1.5–4.5)
Glucose: 81 mg/dL (ref 70–99)
Potassium: 4.4 mmol/L (ref 3.5–5.2)
Sodium: 136 mmol/L (ref 134–144)
Total Protein: 7.8 g/dL (ref 6.0–8.5)
eGFR: 87 mL/min/1.73 (ref >59)

## 2024-04-09 LAB — LIPID PANEL
Chol/HDL Ratio: 4.4 ratio (ref 0.0–5.0)
Cholesterol, Total: 193 mg/dL (ref 100–199)
HDL: 44 mg/dL (ref >39)
LDL Chol Calc (NIH): 121 mg/dL — ABNORMAL HIGH (ref 0–99)
Triglycerides: 155 mg/dL — ABNORMAL HIGH (ref 0–149)
VLDL Cholesterol Cal: 28 mg/dL (ref 5–40)

## 2024-04-11 ENCOUNTER — Ambulatory Visit: Payer: Self-pay | Admitting: Family

## 2024-08-08 ENCOUNTER — Ambulatory Visit: Payer: Self-pay | Admitting: Family

## 2024-08-08 ENCOUNTER — Encounter: Payer: Self-pay | Admitting: Family

## 2024-08-08 VITALS — BP 125/78 | HR 90 | Temp 98.2°F | Ht 73.0 in | Wt 187.0 lb

## 2024-08-08 DIAGNOSIS — M5430 Sciatica, unspecified side: Secondary | ICD-10-CM

## 2024-08-08 MED ORDER — METHYLPREDNISOLONE ACETATE 80 MG/ML IJ SUSP
80.0000 mg | Freq: Once | INTRAMUSCULAR | Status: AC
  Administered 2024-08-08: 80 mg via INTRAMUSCULAR

## 2024-08-08 MED ORDER — KETOROLAC TROMETHAMINE 60 MG/2ML IM SOLN
60.0000 mg | Freq: Once | INTRAMUSCULAR | Status: AC
  Administered 2024-08-08: 60 mg via INTRAMUSCULAR

## 2024-08-08 MED ORDER — BACLOFEN 10 MG PO TABS: 10.0000 mg | ORAL_TABLET | Freq: Three times a day (TID) | ORAL | 0 refills | Status: AC

## 2024-08-08 MED ORDER — DICLOFENAC SODIUM 75 MG PO TBEC: 75.0000 mg | DELAYED_RELEASE_TABLET | Freq: Two times a day (BID) | ORAL | 0 refills | Status: AC

## 2024-08-08 NOTE — Patient Instructions (Signed)

## 2024-08-08 NOTE — Progress Notes (Signed)
 "  Subjective:    Patient ID: Javier Hess, male    DOB: 2001/07/07, 23 y.o.   MRN: [id number]  Chief Complaint  Patient presents with   leg numb    Left leg in the back goes from the back of leg from hip all the way down for the past 3 weeks   Pt presents to the office today with left buttocks and leg pain that started three weeks ago. Denies any injury.   Has tried magnesium and lidocaine .  Back Pain This is a new problem. The current episode started 1 to 4 weeks ago. The problem occurs intermittently. The problem is unchanged. The pain is present in the gluteal. The quality of the pain is described as aching. The pain radiates to the left thigh. The pain is at a severity of 8/10. The pain is moderate. The symptoms are aggravated by twisting and bending. Associated symptoms include leg pain and numbness. He has tried bed rest and NSAIDs for the symptoms. The treatment provided mild relief.      Review of Systems  Musculoskeletal:  Positive for back pain.  Neurological:  Positive for numbness.  All other systems reviewed and are negative.   Social History   Socioeconomic History   Marital status: Single    Spouse name: Not on file   Number of children: Not on file   Years of education: Not on file   Highest education level: Bachelor's degree (e.g., BA, AB, BS)  Occupational History   Occupation: student  Tobacco Use   Smoking status: Never   Smokeless tobacco: Never  Vaping Use   Vaping status: Never Used  Substance and Sexual Activity   Alcohol use: Not Currently   Drug use: Never   Sexual activity: Not Currently    Birth control/protection: Condom  Other Topics Concern   Not on file  Social History Narrative   Not on file   Social Drivers of Health   Tobacco Use: Low Risk (08/08/2024)   Patient History    Smoking Tobacco Use: Never    Smokeless Tobacco Use: Never    Passive Exposure: Not on file  Financial Resource Strain: Low Risk (04/04/2024)    Overall Financial Resource Strain (CARDIA)    Difficulty of Paying Living Expenses: Not hard at all  Food Insecurity: No Food Insecurity (04/04/2024)   Epic    Worried About Radiation Protection Practitioner of Food in the Last Year: Never true    Ran Out of Food in the Last Year: Never true  Transportation Needs: No Transportation Needs (04/04/2024)   Epic    Lack of Transportation (Medical): No    Lack of Transportation (Non-Medical): No  Physical Activity: Sufficiently Active (04/04/2024)   Exercise Vital Sign    Days of Exercise per Week: 5 days    Minutes of Exercise per Session: 60 min  Stress: No Stress Concern Present (04/04/2024)   [employer] of Occupational Health - Occupational Stress Questionnaire    Feeling of Stress: Not at all  Social Connections: Socially Integrated (04/04/2024)   Social Connection and Isolation Panel    Frequency of Communication with Friends and Family: More than three times a week    Frequency of Social Gatherings with Friends and Family: More than three times a week    Attends Religious Services: More than 4 times per year    Active Member of [employer] or Organizations: Yes    Attends Banker Meetings: More than 4 times per year  Marital Status: Married  Depression (PHQ2-9): Low Risk (08/08/2024)   Depression (PHQ2-9)    PHQ-2 Score: 0  Alcohol Screen: Low Risk (09/28/2023)   Alcohol Screen    Last Alcohol Screening Score (AUDIT): 0  Housing: Low Risk (04/04/2024)   Epic    Unable to Pay for Housing in the Last Year: No    Number of Times Moved in the Last Year: 1    Homeless in the Last Year: No  Utilities: Not on file  Health Literacy: Not on file   Family History  Problem Relation Age of Onset   Cancer Maternal Grandmother        Breast Cancer   Stroke Paternal Grandfather         Objective:   Physical Exam Vitals reviewed.  Constitutional:      General: He is not in acute distress.    Appearance: He is well-developed.  HENT:      Head: Normocephalic.  Eyes:     General:        Right eye: No discharge.        Left eye: No discharge.     Pupils: Pupils are equal, round, and reactive to light.  Neck:     Thyroid : No thyromegaly.  Cardiovascular:     Rate and Rhythm: Normal rate and regular rhythm.     Heart sounds: Normal heart sounds. No murmur heard. Pulmonary:     Effort: Pulmonary effort is normal. No respiratory distress.     Breath sounds: Normal breath sounds. No wheezing.  Abdominal:     General: Bowel sounds are normal. There is no distension.     Palpations: Abdomen is soft.     Tenderness: There is no abdominal tenderness.  Musculoskeletal:        General: Tenderness (mild tendernes in left buttocks) present. Normal range of motion.     Cervical back: Normal range of motion and neck supple.  Skin:    General: Skin is warm and dry.     Findings: No erythema or rash.  Neurological:     Mental Status: He is alert and oriented to person, place, and time.     Cranial Nerves: No cranial nerve deficit.     Deep Tendon Reflexes: Reflexes are normal and symmetric.  Psychiatric:        Behavior: Behavior normal.        Thought Content: Thought content normal.        Judgment: Judgment normal.       BP 125/78   Pulse 90   Temp 98.2 F (36.8 C)   Ht 6' 1 (1.854 m)   Wt 187 lb (84.8 kg)   BMI 24.67 kg/m      Assessment & Plan:  Javier Hess comes in today with chief complaint of leg numb (Left leg in the back goes from the back of leg from hip all the way down for the past 3 weeks)   Diagnosis and orders addressed:  1. Sciatic leg pain (Primary) Rest Ice ROM exercises, handout given  Start diclofenac  BID with food, no other NSAID's  Baclofen  as needed, sedation precautions  Follow up if symptoms worsen or do not improve  - methylPREDNISolone  acetate (DEPO-MEDROL ) injection 80 mg - ketorolac  (TORADOL ) injection 60 mg - baclofen  (LIORESAL ) 10 MG tablet; Take 1 tablet (10 mg total)  by mouth 3 (three) times daily.  Dispense: 30 each; Refill: 0 - diclofenac  (VOLTAREN ) 75 MG EC tablet; Take 1 tablet (  75 mg total) by mouth 2 (two) times daily.  Dispense: 60 tablet; Refill: 0  Bari Learn, FNP   "

## 2024-10-14 ENCOUNTER — Ambulatory Visit: Payer: Self-pay | Admitting: Family
